# Patient Record
Sex: Male | Born: 1980 | Race: Black or African American | Hispanic: No | Marital: Single | Smoking: Never smoker
Health system: Southern US, Community
[De-identification: ages and names within clinical notes are randomized; demographics above are authoritative.]

---

## 2016-12-16 ENCOUNTER — Emergency Department: Payer: Self-pay

## 2016-12-16 ENCOUNTER — Observation Stay
Admission: EM | Admit: 2016-12-16 | Discharge: 2016-12-18 | Disposition: A | Discharge status: Home / Self Care | Payer: Self-pay | Attending: General Surgery | Admitting: General Surgery

## 2016-12-16 ENCOUNTER — Encounter: Payer: Self-pay | Admitting: Emergency Medicine

## 2016-12-16 DIAGNOSIS — K353 Acute appendicitis with localized peritonitis, without perforation or gangrene: Secondary | ICD-10-CM

## 2016-12-16 DIAGNOSIS — K3580 Unspecified acute appendicitis: Principal | ICD-10-CM | POA: Insufficient documentation

## 2016-12-16 DIAGNOSIS — K37 Unspecified appendicitis: Secondary | ICD-10-CM | POA: Diagnosis present

## 2016-12-16 LAB — URINALYSIS, COMPLETE (UACMP) WITH MICROSCOPIC
BACTERIA UA: NONE SEEN
Bilirubin Urine: NEGATIVE
Glucose, UA: NEGATIVE mg/dL
Hgb urine dipstick: NEGATIVE
Ketones, ur: NEGATIVE mg/dL
Nitrite: NEGATIVE
PROTEIN: NEGATIVE mg/dL
Specific Gravity, Urine: 1.016 (ref 1.005–1.030)
pH: 5 (ref 5.0–8.0)

## 2016-12-16 LAB — COMPREHENSIVE METABOLIC PANEL
ALBUMIN: 4.1 g/dL (ref 3.5–5.0)
ALK PHOS: 58 U/L (ref 38–126)
ALT: 33 U/L (ref 17–63)
ANION GAP: 7 (ref 5–15)
AST: 28 U/L (ref 15–41)
BUN: 12 mg/dL (ref 6–20)
CO2: 30 mmol/L (ref 22–32)
Calcium: 9 mg/dL (ref 8.9–10.3)
Chloride: 102 mmol/L (ref 101–111)
Creatinine, Ser: 1.1 mg/dL (ref 0.61–1.24)
GFR calc Af Amer: >60 mL/min (ref >60)
GFR calc non Af Amer: >60 mL/min (ref >60)
GLUCOSE: 99 mg/dL (ref 65–99)
POTASSIUM: 4 mmol/L (ref 3.5–5.1)
SODIUM: 139 mmol/L (ref 135–145)
Total Bilirubin: 0.9 mg/dL (ref 0.3–1.2)
Total Protein: 7.8 g/dL (ref 6.5–8.1)

## 2016-12-16 LAB — CBC
HEMATOCRIT: 43.9 % (ref 40.0–52.0)
HEMOGLOBIN: 15.1 g/dL (ref 13.0–18.0)
MCH: 29.7 pg (ref 26.0–34.0)
MCHC: 34.4 g/dL (ref 32.0–36.0)
MCV: 86.3 fL (ref 80.0–100.0)
Platelets: 247 10*3/uL (ref 150–440)
RBC: 5.08 MIL/uL (ref 4.40–5.90)
RDW: 14 % (ref 11.5–14.5)
WBC: 6.4 10*3/uL (ref 3.8–10.6)

## 2016-12-16 LAB — LIPASE, BLOOD: Lipase: 27 U/L (ref 11–51)

## 2016-12-16 MED ORDER — SODIUM CHLORIDE 0.9 % IV BOLUS (SEPSIS)
1000.0000 mL | Freq: Once | INTRAVENOUS | Status: AC
  Administered 2016-12-16: 1000 mL via INTRAVENOUS

## 2016-12-16 MED ORDER — FENTANYL CITRATE (PF) 100 MCG/2ML IJ SOLN
100.0000 ug | INTRAMUSCULAR | Status: DC | PRN
  Administered 2016-12-16: 100 ug via INTRAVENOUS
  Filled 2016-12-16: qty 2

## 2016-12-16 MED ORDER — IOPAMIDOL (ISOVUE-300) INJECTION 61%
100.0000 mL | Freq: Once | INTRAVENOUS | Status: AC | PRN
  Administered 2016-12-16: 100 mL via INTRAVENOUS

## 2016-12-16 NOTE — ED Notes (Signed)
Onalee Hua RN, aware of assigned bed

## 2016-12-16 NOTE — ED Provider Notes (Signed)
[facility] Emergency Department Provider Note    None    (approximate)  I have reviewed the triage vital signs and the nursing notes.   HISTORY  Chief Complaint Abdominal Pain    HPI Pedro Whiters is a 36 y.o. male Modena Jansky with a chief complaint of roughly 24-36 hours of initially crampy periumbilical abdominal pain now radiating to the right lower quadrant today. Patient does not endorse any vomiting or diarrhea. States that he did see some blood in his stool but it was not large volume hematochezia. No measured fevers but may have had some chills earlier in the day. Denies any chest pain or shortness of breath. No previous abdominal surgeries. No family history of Crohn's or ulcerative colitis. No family history of bleeding disorders. He does not smoke.  No fmhx of sickle cell disease or trait.    History reviewed. No pertinent past medical history. No family history on file. History reviewed. No pertinent surgical history. There are no active problems to display for this patient.     Prior to Admission medications   Not on File    Allergies Patient has no known allergies.    Social History Social History  Substance Use Topics  . Smoking status: Never Smoker  . Smokeless tobacco: Not on file  . Alcohol use Not on file    Review of Systems Patient denies headaches, rhinorrhea, blurry vision, numbness, shortness of breath, chest pain, edema, cough, abdominal pain, nausea, vomiting, diarrhea, dysuria, fevers, rashes or hallucinations unless otherwise stated above in HPI. ____________________________________________   PHYSICAL EXAM:  VITAL SIGNS: Vitals:   12/16/16 1741  BP: (!) 150/99  Pulse: 77  Resp: 16  Temp: 99 F (37.2 C)  SpO2: 98%    Constitutional: Alert and oriented. Well appearing and in no acute distress. Eyes: Conjunctivae are normal.  Head: Atraumatic. Nose: No congestion/rhinnorhea. Mouth/Throat: Mucous  membranes are moist.   Neck: No stridor. Painless ROM.  Cardiovascular: Normal rate, regular rhythm. Grossly normal heart sounds.  Good peripheral circulation. Respiratory: Normal respiratory effort.  No retractions. Lungs CTAB. Gastrointestinal: Soft with ttp of RLQ with rebound ttp.. No distention. No abdominal bruits. No CVA tenderness. Genitourinary:  Musculoskeletal: No lower extremity tenderness nor edema.  No joint effusions. Neurologic:  Normal speech and language. No gross focal neurologic deficits are appreciated. No facial droop Skin:  Skin is warm, dry and intact. No rash noted. Psychiatric: Mood and affect are normal. Speech and behavior are normal.  ____________________________________________   LABS (all labs ordered are listed, but only abnormal results are displayed)  Results for orders placed or performed during the hospital encounter of 12/16/16 (from the past 24 hour(s))  Lipase, blood     Status: None   Collection Time: 12/16/16  5:52 PM  Result Value Ref Range   Lipase 27 11 - 51 U/L  Comprehensive metabolic panel     Status: None   Collection Time: 12/16/16  5:52 PM  Result Value Ref Range   Sodium 139 135 - 145 mmol/L   Potassium 4.0 3.5 - 5.1 mmol/L   Chloride 102 101 - 111 mmol/L   CO2 30 22 - 32 mmol/L   Glucose, Bld 99 65 - 99 mg/dL   BUN 12 6 - 20 mg/dL   Creatinine, Ser 1.30 0.61 - 1.24 mg/dL   Calcium 9.0 8.9 - 86.5 mg/dL   Total Protein 7.8 6.5 - 8.1 g/dL   Albumin 4.1 3.5 - 5.0 g/dL  AST 28 15 - 41 U/L   ALT 33 17 - 63 U/L   Alkaline Phosphatase 58 38 - 126 U/L   Total Bilirubin 0.9 0.3 - 1.2 mg/dL   GFR calc non Af Amer >60 >60 mL/min   GFR calc Af Amer >60 >60 mL/min   Anion gap 7 5 - 15  CBC     Status: None   Collection Time: 12/16/16  5:52 PM  Result Value Ref Range   WBC 6.4 3.8 - 10.6 K/uL   RBC 5.08 4.40 - 5.90 MIL/uL   Hemoglobin 15.1 13.0 - 18.0 g/dL   HCT 16.1 09.6 - 04.5 %   MCV 86.3 80.0 - 100.0 fL   MCH 29.7 26.0 - 34.0  pg   MCHC 34.4 32.0 - 36.0 g/dL   RDW 40.9 81.1 - 91.4 %   Platelets 247 150 - 440 K/uL  Urinalysis, Complete w Microscopic     Status: Abnormal   Collection Time: 12/16/16  5:56 PM  Result Value Ref Range   Color, Urine YELLOW (A) YELLOW   APPearance HAZY (A) CLEAR   Specific Gravity, Urine 1.016 1.005 - 1.030   pH 5.0 5.0 - 8.0   Glucose, UA NEGATIVE NEGATIVE mg/dL   Hgb urine dipstick NEGATIVE NEGATIVE   Bilirubin Urine NEGATIVE NEGATIVE   Ketones, ur NEGATIVE NEGATIVE mg/dL   Protein, ur NEGATIVE NEGATIVE mg/dL   Nitrite NEGATIVE NEGATIVE   Leukocytes, UA SMALL (A) NEGATIVE   RBC / HPF 0-5 0 - 5 RBC/hpf   WBC, UA TOO NUMEROUS TO COUNT 0 - 5 WBC/hpf   Bacteria, UA NONE SEEN NONE SEEN   Squamous Epithelial / LPF 0-5 (A) NONE SEEN   Mucus PRESENT    ____________________________________________ ____________________________________________  RADIOLOGY  I personally reviewed all radiographic images ordered to evaluate for the above acute complaints and reviewed radiology reports and findings.  These findings were personally discussed with the patient.  Please see medical record for radiology report.  ____________________________________________   PROCEDURES  Procedure(s) performed:  Procedures    Critical Care performed: no ____________________________________________   INITIAL IMPRESSION / ASSESSMENT AND PLAN / ED COURSE  Pertinent labs & imaging results that were available during my care of the patient were reviewed by me and considered in my medical decision making (see chart for details).  DDX: appy, colitis,. Ibd, uti, hernia, constipation, msk strain  Lashon Hillier is a 36 y.o. who presents to the ED with Abdominal pain as described above. Patient afebrile with reassuring blood work however he does have significant tenderness right lower quadrant. Based on his exam I am concerned for the above differential particularly appendicitis will order CT imaging to  further characterize.  The patient will be placed on continuous pulse oximetry and telemetry for monitoring.   Clinical Course as of Dec 16 2325  Sat Dec 16, 2016  2313 CT imaging does show evidence of uncomplicated early appendicitis. Fortunately, the patient has no signs of sepsis at this point. I did consult Dr. Tonita Cong of general surgery. Discussed results of testing with patient. He is up-to-date with plan.  [PR]    Clinical Course User Index [PR] Willy Eddy, MD     ____________________________________________   FINAL CLINICAL IMPRESSION(S) / ED DIAGNOSES  Final diagnoses:  Acute appendicitis with localized peritonitis      NEW MEDICATIONS STARTED DURING THIS VISIT:  New Prescriptions   No medications on file     Note:  This document was prepared using Dragon  voice recognition software and may include unintentional dictation errors.    Willy Eddy, MD 12/16/16 [phone]

## 2016-12-16 NOTE — H&P (Signed)
Patient ID: Brendan Knight, male   DOB: 1980-08-28, 36 y.o.   MRN: [id number]  CC: Abdominal pain  HPI Brendan Knight is a 36 y.o. male who presents emergency department with complaints of abdominal pain. Patient reports that he had some crampy abdominal pain earlier in the week but earlier today he developed new right lower quadrant abdominal pain. He reports she's never had pain like this before. It is constant and worse with palpation. It is described as a stabbing sensation. He's also had laxative induced diarrhea and thinks he noticed some blood per stool. He denies any fevers, chills, nausea, vomiting, chest pain, shortness breath, constipation. He has had a headache and took Tylenol for that. He is otherwise in his usual state of excellent health.  HPI  History reviewed. No pertinent past medical history. Currently on no medications.  History reviewed. No pertinent surgical history. Has had no prior abdominal surgeries but had an orthoscopic knee surgery.  Family history: No family history known of cancer, diabetes, heart disease.  Social History Social History  Substance Use Topics  . Smoking status: Never Smoker  . Smokeless tobacco: Not on file  . Alcohol use Not on file    No Known Allergies  Current Facility-Administered Medications  Medication Dose Route Frequency Provider Last Rate Last Dose  . fentaNYL (SUBLIMAZE) injection 100 mcg  100 mcg Intravenous Q1H PRN Willy Eddy, MD   100 mcg at 12/16/16 2220   No current outpatient prescriptions on file.     Review of Systems A 10 point review of systems was asked and was negative except for the Findings documented in the history of present illness  Physical Exam Blood pressure (!) 156/98, pulse 76, temperature 99 F (37.2 C), temperature source Oral, resp. rate 16, height  (1.88 m), weight 102.1 kg (225 lb), SpO2 99 %. CONSTITUTIONAL: No acute distress. EYES: Pupils are equal, round, and reactive to  light, Sclera are non-icteric. EARS, NOSE, MOUTH AND THROAT: The oropharynx is clear. The oral mucosa is pink and moist. Hearing is intact to voice. LYMPH NODES:  Lymph nodes in the neck are normal. RESPIRATORY:  Lungs are clear. There is normal respiratory effort, with equal breath sounds bilaterally, and without pathologic use of accessory muscles. CARDIOVASCULAR: Heart is regular without murmurs, gallops, or rubs. GI: The abdomen is soft, minimally tender to palpation in the right lower quadrant at McBurney's point but negative Rovsing and no rebound or guarding, and nondistended. There are no palpable masses. There is no hepatosplenomegaly. There are normal bowel sounds in all quadrants. GU: Rectal deferred.   MUSCULOSKELETAL: Normal muscle strength and tone. No cyanosis or edema.   SKIN: Turgor is good and there are no pathologic skin lesions or ulcers. NEUROLOGIC: Motor and sensation is grossly normal. Cranial nerves are grossly intact. PSYCH:  Oriented to person, place and time. Affect is normal.  Data Reviewed Images and labs reviewed. Labs are all within normal limits with a normal white blood cell count of 6.4 and normal I's. CT scan shows a mildly dilated appendix consistent with early acute appendicitis. No evidence of free air or fluid fluid within the abdomen I have personally reviewed the patient's imaging, laboratory findings and medical records.    Assessment    Early acute appendicitis    Plan    36 year old male with early acute appendicitis. Described the treatment options of surgery versus admission for antibiotic therapy. Discussed risks, benefits, alternatives of each. After this discussion patient elects to attempt  antibiotic therapy. He understands that there is a failure rate of antibiotic therapy and he may likely require surgery for removal of his appendix. Plan to bring him into the hospital under observation. IV antibiotics, nothing by mouth, IV fluids, as needed  medications. His abdomen will be examined again in the morning and should his pain not be markedly improved he'll be offered surgery again. Patient agrees with this plan.     Time spent with the patient was 50 minutes, with more than 50% of the time spent in face-to-face education, counseling and care coordination.     Ricarda Frame, MD FACS General Surgeon 12/16/2016, 11:44 PM

## 2016-12-16 NOTE — ED Triage Notes (Signed)
Abdominal pain since yesterday. No vomiting or diarrhea. Cramping feeling mostly lower R abd.

## 2016-12-16 NOTE — ED Triage Notes (Signed)
Patient to stat desk asking about lab results and wait times. Patient updated on wait time. Patient in no acute distress at this time.

## 2016-12-17 ENCOUNTER — Encounter
Admission: EM | Disposition: A | Discharge status: Home / Self Care | Payer: Self-pay | Source: Home / Self Care | Attending: Student in an Organized Health Care Education/Training Program

## 2016-12-17 ENCOUNTER — Observation Stay: Payer: Self-pay | Admitting: Anesthesiology

## 2016-12-17 ENCOUNTER — Encounter: Payer: Self-pay | Admitting: *Deleted

## 2016-12-17 HISTORY — PX: LAPAROSCOPIC APPENDECTOMY: SHX408

## 2016-12-17 LAB — COMPREHENSIVE METABOLIC PANEL
ALK PHOS: 52 U/L (ref 38–126)
ALT: 28 U/L (ref 17–63)
ANION GAP: 7 (ref 5–15)
AST: 23 U/L (ref 15–41)
Albumin: 3.7 g/dL (ref 3.5–5.0)
BUN: 11 mg/dL (ref 6–20)
CALCIUM: 8.6 mg/dL — AB (ref 8.9–10.3)
CO2: 29 mmol/L (ref 22–32)
Chloride: 102 mmol/L (ref 101–111)
Creatinine, Ser: 1.05 mg/dL (ref 0.61–1.24)
GFR calc non Af Amer: >60 mL/min (ref >60)
Glucose, Bld: 106 mg/dL — ABNORMAL HIGH (ref 65–99)
Potassium: 3.7 mmol/L (ref 3.5–5.1)
SODIUM: 138 mmol/L (ref 135–145)
TOTAL PROTEIN: 7.1 g/dL (ref 6.5–8.1)
Total Bilirubin: 1 mg/dL (ref 0.3–1.2)

## 2016-12-17 LAB — CBC
HCT: 41.3 % (ref 40.0–52.0)
HEMOGLOBIN: 14.1 g/dL (ref 13.0–18.0)
MCH: 29.2 pg (ref 26.0–34.0)
MCHC: 34.2 g/dL (ref 32.0–36.0)
MCV: 85.3 fL (ref 80.0–100.0)
Platelets: 228 10*3/uL (ref 150–440)
RBC: 4.85 MIL/uL (ref 4.40–5.90)
RDW: 13.7 % (ref 11.5–14.5)
WBC: 6.6 10*3/uL (ref 3.8–10.6)

## 2016-12-17 LAB — MRSA PCR SCREENING: MRSA by PCR: NEGATIVE

## 2016-12-17 SURGERY — APPENDECTOMY, LAPAROSCOPIC
Anesthesia: General

## 2016-12-17 MED ORDER — ROCURONIUM BROMIDE 100 MG/10ML IV SOLN
INTRAVENOUS | Status: DC | PRN
  Administered 2016-12-17: 20 mg via INTRAVENOUS

## 2016-12-17 MED ORDER — HYDRALAZINE HCL 20 MG/ML IJ SOLN
10.0000 mg | Freq: Once | INTRAMUSCULAR | Status: DC
  Filled 2016-12-17: qty 1

## 2016-12-17 MED ORDER — LACTATED RINGERS IV SOLN
INTRAVENOUS | Status: DC | PRN
Start: 2016-12-17 — End: 2016-12-17
  Administered 2016-12-17 (×2): via INTRAVENOUS

## 2016-12-17 MED ORDER — PROPOFOL 10 MG/ML IV BOLUS
INTRAVENOUS | Status: DC | PRN
  Administered 2016-12-17: 200 mg via INTRAVENOUS

## 2016-12-17 MED ORDER — METRONIDAZOLE IN NACL 5-0.79 MG/ML-% IV SOLN
500.0000 mg | Freq: Three times a day (TID) | INTRAVENOUS | Status: DC
  Administered 2016-12-17 – 2016-12-18 (×4): 500 mg via INTRAVENOUS
  Filled 2016-12-17 (×7): qty 100

## 2016-12-17 MED ORDER — OXYCODONE-ACETAMINOPHEN 5-325 MG PO TABS
1.0000 | ORAL_TABLET | ORAL | Status: DC | PRN
  Administered 2016-12-17: 1 via ORAL
  Filled 2016-12-17: qty 1

## 2016-12-17 MED ORDER — BUPIVACAINE-EPINEPHRINE 0.25% -1:200000 IJ SOLN
INTRAMUSCULAR | Status: DC | PRN
  Administered 2016-12-17: 30 mL

## 2016-12-17 MED ORDER — LIDOCAINE HCL (PF) 2 % IJ SOLN
INTRAMUSCULAR | Status: AC
  Filled 2016-12-17: qty 2

## 2016-12-17 MED ORDER — MIDAZOLAM HCL 2 MG/2ML IJ SOLN
INTRAMUSCULAR | Status: AC
  Filled 2016-12-17: qty 2

## 2016-12-17 MED ORDER — ONDANSETRON HCL 4 MG/2ML IJ SOLN
INTRAMUSCULAR | Status: DC | PRN
  Administered 2016-12-17: 4 mg via INTRAVENOUS

## 2016-12-17 MED ORDER — BUPIVACAINE-EPINEPHRINE (PF) 0.25% -1:200000 IJ SOLN
INTRAMUSCULAR | Status: AC
  Filled 2016-12-17: qty 30

## 2016-12-17 MED ORDER — OXYCODONE HCL 5 MG PO TABS: 5.0000 mg | ORAL_TABLET | Freq: Once | ORAL | Status: DC | PRN

## 2016-12-17 MED ORDER — KETOROLAC TROMETHAMINE 30 MG/ML IJ SOLN
INTRAMUSCULAR | Status: DC | PRN
  Administered 2016-12-17: 30 mg via INTRAVENOUS

## 2016-12-17 MED ORDER — MORPHINE SULFATE (PF) 4 MG/ML IV SOLN: 4.0000 mg | INTRAVENOUS | Status: DC | PRN

## 2016-12-17 MED ORDER — KETOROLAC TROMETHAMINE 30 MG/ML IJ SOLN
30.0000 mg | Freq: Four times a day (QID) | INTRAMUSCULAR | Status: DC
  Administered 2016-12-17 – 2016-12-18 (×4): 30 mg via INTRAVENOUS
  Filled 2016-12-17 (×4): qty 1

## 2016-12-17 MED ORDER — DEXAMETHASONE SODIUM PHOSPHATE 10 MG/ML IJ SOLN
INTRAMUSCULAR | Status: DC | PRN
  Administered 2016-12-17: 10 mg via INTRAVENOUS

## 2016-12-17 MED ORDER — KETOROLAC TROMETHAMINE 30 MG/ML IJ SOLN
INTRAMUSCULAR | Status: AC
  Filled 2016-12-17: qty 1

## 2016-12-17 MED ORDER — EPHEDRINE SULFATE 50 MG/ML IJ SOLN
INTRAMUSCULAR | Status: DC | PRN
  Administered 2016-12-17: 25 mg via INTRAVENOUS

## 2016-12-17 MED ORDER — DIPHENHYDRAMINE HCL 25 MG PO CAPS: 25.0000 mg | ORAL_CAPSULE | Freq: Four times a day (QID) | ORAL | Status: DC | PRN

## 2016-12-17 MED ORDER — GLYCOPYRROLATE 0.2 MG/ML IJ SOLN
INTRAMUSCULAR | Status: DC | PRN
  Administered 2016-12-17: 0.4 mg via INTRAVENOUS

## 2016-12-17 MED ORDER — DIPHENHYDRAMINE HCL 50 MG/ML IJ SOLN: 25.0000 mg | Freq: Four times a day (QID) | INTRAMUSCULAR | Status: DC | PRN

## 2016-12-17 MED ORDER — NEOSTIGMINE METHYLSULFATE 10 MG/10ML IV SOLN
INTRAVENOUS | Status: AC
  Filled 2016-12-17: qty 1

## 2016-12-17 MED ORDER — FENTANYL CITRATE (PF) 250 MCG/5ML IJ SOLN
INTRAMUSCULAR | Status: AC
  Filled 2016-12-17: qty 5

## 2016-12-17 MED ORDER — HYDRALAZINE HCL 20 MG/ML IJ SOLN: 10.0000 mg | INTRAMUSCULAR | Status: DC | PRN

## 2016-12-17 MED ORDER — SUCCINYLCHOLINE CHLORIDE 20 MG/ML IJ SOLN
INTRAMUSCULAR | Status: DC | PRN
  Administered 2016-12-17: 100 mg via INTRAVENOUS

## 2016-12-17 MED ORDER — SUCCINYLCHOLINE CHLORIDE 20 MG/ML IJ SOLN
INTRAMUSCULAR | Status: AC
  Filled 2016-12-17: qty 1

## 2016-12-17 MED ORDER — PROPOFOL 10 MG/ML IV BOLUS
INTRAVENOUS | Status: AC
  Filled 2016-12-17: qty 20

## 2016-12-17 MED ORDER — FENTANYL CITRATE (PF) 100 MCG/2ML IJ SOLN
25.0000 ug | INTRAMUSCULAR | Status: DC | PRN
  Administered 2016-12-17 (×2): 50 ug via INTRAVENOUS

## 2016-12-17 MED ORDER — LIDOCAINE HCL (CARDIAC) 20 MG/ML IV SOLN
INTRAVENOUS | Status: DC | PRN
  Administered 2016-12-17: 40 mg via INTRAVENOUS

## 2016-12-17 MED ORDER — GLYCOPYRROLATE 0.2 MG/ML IJ SOLN
INTRAMUSCULAR | Status: AC
  Filled 2016-12-17: qty 2

## 2016-12-17 MED ORDER — OXYCODONE HCL 5 MG/5ML PO SOLN: 5.0000 mg | Freq: Once | ORAL | Status: DC | PRN

## 2016-12-17 MED ORDER — NEOSTIGMINE METHYLSULFATE 10 MG/10ML IV SOLN
INTRAVENOUS | Status: DC | PRN
  Administered 2016-12-17: 4 mg via INTRAVENOUS

## 2016-12-17 MED ORDER — FENTANYL CITRATE (PF) 100 MCG/2ML IJ SOLN
INTRAMUSCULAR | Status: DC | PRN
  Administered 2016-12-17: 150 ug via INTRAVENOUS
  Administered 2016-12-17: 100 ug via INTRAVENOUS

## 2016-12-17 MED ORDER — ONDANSETRON HCL 4 MG/2ML IJ SOLN: 4.0000 mg | Freq: Four times a day (QID) | INTRAMUSCULAR | Status: DC | PRN

## 2016-12-17 MED ORDER — CEFTRIAXONE SODIUM 2 G IJ SOLR
2.0000 g | INTRAMUSCULAR | Status: DC
  Administered 2016-12-17 – 2016-12-18 (×2): 2 g via INTRAVENOUS
  Filled 2016-12-17 (×3): qty 2

## 2016-12-17 MED ORDER — DEXTROSE IN LACTATED RINGERS 5 % IV SOLN
INTRAVENOUS | Status: DC
  Administered 2016-12-17 (×4): via INTRAVENOUS

## 2016-12-17 MED ORDER — ONDANSETRON 4 MG PO TBDP: 4.0000 mg | ORAL_TABLET | Freq: Four times a day (QID) | ORAL | Status: DC | PRN

## 2016-12-17 MED ORDER — MIDAZOLAM HCL 2 MG/2ML IJ SOLN
INTRAMUSCULAR | Status: DC | PRN
  Administered 2016-12-17: 2 mg via INTRAVENOUS

## 2016-12-17 MED ORDER — DEXAMETHASONE SODIUM PHOSPHATE 10 MG/ML IJ SOLN
INTRAMUSCULAR | Status: AC
  Filled 2016-12-17: qty 1

## 2016-12-17 MED ORDER — FENTANYL CITRATE (PF) 100 MCG/2ML IJ SOLN
INTRAMUSCULAR | Status: AC
  Administered 2016-12-17: 13:00:00
  Filled 2016-12-17: qty 2

## 2016-12-17 MED ORDER — ONDANSETRON HCL 4 MG/2ML IJ SOLN
INTRAMUSCULAR | Status: AC
  Filled 2016-12-17: qty 2

## 2016-12-17 SURGICAL SUPPLY — 36 items
APPLIER CLIP 5 13 M/L LIGAMAX5 (MISCELLANEOUS) ×3
BLADE CLIPPER SURG (BLADE) ×3 IMPLANT
CANISTER SUCT 1200ML W/VALVE (MISCELLANEOUS) ×3 IMPLANT
CHLORAPREP W/TINT 26ML (MISCELLANEOUS) ×3 IMPLANT
CLIP APPLIE 5 13 M/L LIGAMAX5 (MISCELLANEOUS) ×1 IMPLANT
CUTTER FLEX LINEAR 45M (STAPLE) ×3 IMPLANT
DERMABOND ADVANCED (GAUZE/BANDAGES/DRESSINGS) ×2
DERMABOND ADVANCED .7 DNX12 (GAUZE/BANDAGES/DRESSINGS) ×1 IMPLANT
ELECT CAUTERY BLADE 6.4 (BLADE) ×3 IMPLANT
ELECT REM PT RETURN 9FT ADLT (ELECTROSURGICAL) ×3
ELECTRODE REM PT RTRN 9FT ADLT (ELECTROSURGICAL) ×1 IMPLANT
GLOVE BIO SURGEON STRL SZ7 (GLOVE) ×9 IMPLANT
GOWN STRL REUS W/ TWL LRG LVL3 (GOWN DISPOSABLE) ×2 IMPLANT
GOWN STRL REUS W/TWL LRG LVL3 (GOWN DISPOSABLE) ×4
IRRIGATION STRYKERFLOW (MISCELLANEOUS) ×1 IMPLANT
IRRIGATOR STRYKERFLOW (MISCELLANEOUS) ×3
IV NS 1000ML (IV SOLUTION) ×2
IV NS 1000ML BAXH (IV SOLUTION) ×1 IMPLANT
NEEDLE HYPO 22GX1.5 SAFETY (NEEDLE) ×3 IMPLANT
NS IRRIG 500ML POUR BTL (IV SOLUTION) ×6 IMPLANT
PACK LAP CHOLECYSTECTOMY (MISCELLANEOUS) ×3 IMPLANT
PENCIL ELECTRO HAND CTR (MISCELLANEOUS) ×3 IMPLANT
POUCH SPECIMEN RETRIEVAL 10MM (ENDOMECHANICALS) ×3 IMPLANT
RELOAD 45 VASCULAR/THIN (ENDOMECHANICALS) ×3 IMPLANT
RELOAD STAPLE TA45 3.5 REG BLU (ENDOMECHANICALS) ×3 IMPLANT
SCALPEL HARMONIC ACE (MISCELLANEOUS) ×3 IMPLANT
SCISSORS METZENBAUM CVD 33 (INSTRUMENTS) ×3 IMPLANT
SLEEVE ENDOPATH XCEL 5M (ENDOMECHANICALS) ×3 IMPLANT
SPONGE LAP 18X18 5 PK (GAUZE/BANDAGES/DRESSINGS) ×3 IMPLANT
SUT MNCRL AB 4-0 PS2 18 (SUTURE) ×3 IMPLANT
SUT VICRYL 0 AB UR-6 (SUTURE) ×6 IMPLANT
SYR 20CC LL (SYRINGE) ×3 IMPLANT
TRAY FOLEY W/METER SILVER 16FR (SET/KITS/TRAYS/PACK) IMPLANT
TROCAR XCEL BLUNT TIP 100MML (ENDOMECHANICALS) ×3 IMPLANT
TROCAR XCEL NON-BLD 5MMX100MML (ENDOMECHANICALS) ×6 IMPLANT
TUBING INSUF HEATED (TUBING) ×3 IMPLANT

## 2016-12-17 NOTE — Anesthesia Post-op Follow-up Note (Signed)
Anesthesia QCDR form completed.        

## 2016-12-17 NOTE — ED Notes (Signed)
Pt transport to 205 

## 2016-12-17 NOTE — Anesthesia Preprocedure Evaluation (Signed)
Anesthesia Evaluation  Patient identified by MRN, date of birth, ID band Patient awake    Reviewed: Allergy & Precautions, H&P , NPO status , Patient's Chart, lab work & pertinent test results  Airway Mallampati: II  TM Distance: >3 FB Neck ROM: full    Dental  (+) Chipped   Pulmonary neg pulmonary ROS, neg shortness of breath,           Cardiovascular Exercise Tolerance: Good (-) angina(-) Past MI and (-) DOE negative cardio ROS       Neuro/Psych negative neurological ROS  negative psych ROS   GI/Hepatic negative GI ROS, Neg liver ROS, neg GERD  ,  Endo/Other  negative endocrine ROS  Renal/GU      Musculoskeletal   Abdominal   Peds  Hematology negative hematology ROS (+)   Anesthesia Other Findings  BMI    Body Mass Index:  28.89 kg/m      Reproductive/Obstetrics negative OB ROS                             Anesthesia Physical Anesthesia Plan  ASA: II  Anesthesia Plan: General ETT   Post-op Pain Management:    Induction: Intravenous  PONV Risk Score and Plan: 3 and Ondansetron, Dexamethasone, Midazolam and Treatment may vary due to age or medical condition  Airway Management Planned: Oral ETT  Additional Equipment:   Intra-op Plan:   Post-operative Plan: Extubation in OR  Informed Consent: I have reviewed the patients History and Physical, chart, labs and discussed the procedure including the risks, benefits and alternatives for the proposed anesthesia with the patient or authorized representative who has indicated his/her understanding and acceptance.   Dental Advisory Given  Plan Discussed with: Anesthesiologist, CRNA and Surgeon  Anesthesia Plan Comments: (Patient consented for risks of anesthesia including but not limited to:  - adverse reactions to medications - damage to teeth, lips or other oral mucosa - sore throat or hoarseness - Damage to heart, brain,  lungs or loss of life  Patient voiced understanding.)        Anesthesia Quick Evaluation

## 2016-12-17 NOTE — Addendum Note (Signed)
Addendum  created 12/17/16 1450 by Aalliyah Kilker, CRNA   Charge Capture section accepted    

## 2016-12-17 NOTE — Op Note (Signed)
laparascopic appendectomy   Madelyn Flavors Date of operation:  12/17/2016  Indications: The patient presented with a history of  abdominal pain. Workup has revealed findings consistent with acute appendicitis.  Pre-operative Diagnosis: Acute appendicitis without mention of peritonitis  Post-operative Diagnosis: Same  Surgeon: Sterling Big, MD, FACS  Anesthesia: General with endotracheal tube  Findings: Acute nonperforated appendicitis  Estimated Blood Loss: 5 cc         Specimens: appendix         Complications:  None  Procedure Details  The patient was seen again in the preop area. The options of surgery versus observation were reviewed with the patient and/or family. The risks of bleeding, infection, recurrence of symptoms, negative laparoscopy, potential for an open procedure, bowel injury, abscess or infection, were all reviewed as well. The patient was taken to Operating Room, identified as Thayden Lemire and the procedure verified as laparoscopic appendectomy. A Time Out was held and the above information confirmed.  The patient was placed in the supine position and general anesthesia was induced.  Antibiotic prophylaxis was administered and VT E prophylaxis was in place. A Foley catheter was placed by the nursing staff.   The abdomen was prepped and draped in a sterile fashion. An infraumbilical incision was made. A cutdown technique was used to enter the abdominal cavity. Two vicryl stitches were placed on the fascia and a Hasson trocar inserted. Pneumoperitoneum obtained. Two 5 mm ports were placed under direct visualization.  The appendix was identified and found to be acutely inflamed  The appendix was carefully dissected. The mesoappendix was divided withHarmonic scalpel. The base of the appendix was dissected out and divided with a standard load Endo GIA.The appendix was placed in a Endo Catch bag and removed via the Hasson port. The right lower quadrant and pelvis was  then irrigated with  normal saline which was aspirated. Inspection  failed to identify any additional bleeding and there were no signs of bowel injury. Again the right lower quadrant was inspected there was no sign of bleeding or bowel injury therefore pneumoperitoneum was released, all ports were removed.  The umbilical fascia was closed with 0 Vicryl interrupted sutures and the skin incisions were approximated with subcuticular 4-0 Monocryl. Dermabond was placed The patient tolerated the procedure well, there were no complications. The sponge lap and needle count were correct at the end of the procedure.  The patient was taken to the recovery room in stable condition to be admitted for continued care.    Sterling Big, MD FACS

## 2016-12-17 NOTE — Anesthesia Procedure Notes (Signed)
Procedure Name: Intubation Date/Time: 12/17/2016 11:11 AM Performed by: Clinton Sawyer Pre-anesthesia Checklist: Patient identified, Emergency Drugs available, Suction available, Patient being monitored and Timeout performed Patient Re-evaluated:Patient Re-evaluated prior to induction Oxygen Delivery Method: Circle system utilized Preoxygenation: Pre-oxygenation with 100% oxygen Induction Type: IV induction and Rapid sequence Laryngoscope Size: Mac and 4 Grade View: Grade I Tube type: Oral Tube size: 7.5 mm Number of attempts: 1 Airway Equipment and Method: Stylet Placement Confirmation: ETT inserted through vocal cords under direct vision,  positive ETCO2,  CO2 detector and breath sounds checked- equal and bilateral Secured at: 22 cm Tube secured with: Tape Dental Injury: Teeth and Oropharynx as per pre-operative assessment

## 2016-12-17 NOTE — Transfer of Care (Signed)
Immediate Anesthesia Transfer of Care Note  Patient: Brendan Knight  Procedure(s) Performed: Procedure(s): APPENDECTOMY LAPAROSCOPIC (N/A)  Patient Location: PACU  Anesthesia Type:General  Level of Consciousness: sedated  Airway & Oxygen Therapy: Patient Spontanous Breathing and Patient connected to nasal cannula oxygen  Post-op Assessment: Report given to RN and Post -op Vital signs reviewed and stable  Post vital signs: Reviewed and stable  Last Vitals:  Vitals:   12/17/16 1013 12/17/16 1205  BP: (!) 152/95 (P) 135/86  Pulse: 70 (!) (P) 56  Resp:  (!) (P) 8  Temp: 37 C (!) (P) 36.2 C  SpO2: 100% (P) 97%    Last Pain:  Vitals:   12/17/16 1013  TempSrc: Oral  PainSc:       Patients Stated Pain Goal: 0 (12/17/16 0107)  Complications: No apparent anesthesia complications

## 2016-12-17 NOTE — Anesthesia Postprocedure Evaluation (Signed)
Anesthesia Post Note  Patient: Brendan Knight  Procedure(s) Performed: Procedure(s) (LRB): APPENDECTOMY LAPAROSCOPIC (N/A)  Patient location during evaluation: PACU Anesthesia Type: General Level of consciousness: awake and alert Pain management: pain level controlled Vital Signs Assessment: post-procedure vital signs reviewed and stable Respiratory status: spontaneous breathing, nonlabored ventilation, respiratory function stable and patient connected to nasal cannula oxygen Cardiovascular status: blood pressure returned to baseline and stable Postop Assessment: no apparent nausea or vomiting Anesthetic complications: no     Last Vitals:  Vitals:   12/17/16 1236 12/17/16 1245  BP: 129/76   Pulse: (!) 51 (!) 54  Resp: 13 12  Temp: (!) 36.1 C   SpO2: 99% 98%    Last Pain:  Vitals:   12/17/16 1245  TempSrc:   PainSc: Asleep                 Cleda Mccreedy Piscitello

## 2016-12-17 NOTE — Progress Notes (Signed)
Asian seen and examined. He continues to have significant pain. His hemodynamics are adequate.PE : Tender to palpation over McBurney's point. The is some rebound.  A/p acute appendicitis with persistent pain and failure with antibiotic management. Discussed with the patient in detail about my recommendation  to proceed with laparoscopic appendectomy. Procedure discussed with patient in detail. Risk, benefits and possible complications including but not limited to: Bleeding, infection bowel injury and re-interventions discussed with him in detail. He understands and wishes to proceed

## 2016-12-18 MED ORDER — OXYCODONE-ACETAMINOPHEN 5-325 MG PO TABS: 1.0000 | ORAL_TABLET | ORAL | 0 refills | Status: DC | PRN

## 2016-12-18 NOTE — Care Management (Addendum)
Patient admitted post lap appy.  Self pay.  Patient states that he just relocated to the area [place] (which is in [facility]). Patient does not have a PCP.  Patient provided with information on [facility], Medication Management, and [employer] center.  Patient also provided with "The Network:  Your Guide to [employer] and [employer] in [facility]"  Booklet.  Patient to discharge with rx for pain medication  RNCM signing off.   Bevelyn Ngo RN, BSN Nurse Case Management: [facility] Coverage for :  [phone]

## 2016-12-18 NOTE — Progress Notes (Signed)
12/18/2016  1:40 PM  Brendan Knight to be D/C'd Home per MD order.  Discussed prescriptions and follow up appointments with the patient. Prescriptions given to patient, medication list explained in detail. Pt verbalized understanding.  Allergies as of 12/18/2016   No Known Allergies     Medication List    TAKE these medications   oxyCODONE-acetaminophen 5-325 MG tablet Commonly known as:  PERCOCET/ROXICET Take 1-2 tablets by mouth every 4 (four) hours as needed for moderate pain.            Discharge Care Instructions        Start     Ordered   12/18/16 0000  oxyCODONE-acetaminophen (PERCOCET/ROXICET) 5-325 MG tablet  Every 4 hours PRN     12/18/16 0918      Vitals:   12/17/16 1947 12/18/16 0457  BP: 126/76 129/73  Pulse: (!) 59 (!) 49  Resp:  20  Temp: 98.3 F (36.8 C) 98.2 F (36.8 C)  SpO2: 100% 100%    Skin clean, dry and intact without evidence of skin break down, no evidence of skin tears noted. IV catheter discontinued intact. Site without signs and symptoms of complications. Dressing and pressure applied. Pt denies pain at this time. No complaints noted.  An After Visit Summary was printed and given to the patient. Patient escorted via WC, and D/C home via private auto.  Brendan Knight

## 2016-12-18 NOTE — Discharge Instructions (Signed)
In addition to included general post-operative instructions for Laparoscopic Appendectomy,  Diet: Resume home heart healthy diet.   Activity: No heavy lifting >20 pounds (children, pets, laundry, garbage) or strenuous activity until follow-up, but light activity and walking are encouraged. Do not drive or drink alcohol if taking narcotic pain medications.  Wound care: 2 days after surgery (Tuesday, 9/18), may shower/get incision wet with soapy water and pat dry (do not rub incisions), but no baths or submerging incision underwater until follow-up.   Medications: Resume all home medications. For mild to moderate pain: acetaminophen (Tylenol) or ibuprofen (if no kidney disease). Combining Tylenol with alcohol can substantially increase your risk of causing liver disease. Narcotic pain medications, if prescribed, can be used for severe pain, though may cause nausea, constipation, and drowsiness. Do not combine Tylenol and Percocet within a 6 hour period as Percocet contains Tylenol. If you do not need the narcotic pain medication, you do not need to fill the prescription.  Call office [phone]) at any time if any questions, worsening pain, fevers/chills, bleeding, drainage from incision site, or other concerns.

## 2016-12-19 ENCOUNTER — Telehealth: Payer: Self-pay

## 2016-12-19 LAB — SURGICAL PATHOLOGY

## 2016-12-19 NOTE — Telephone Encounter (Signed)
Left message for patient to return call regarding post op questions.  

## 2016-12-19 NOTE — Telephone Encounter (Signed)
Post-op call made to patient at this time. Spoke with Madelyn Flavors. Post-op interview questions below.  1. How are you feeling? Some sorness  2. Is your pain controlled? yes  3. What are you doing for the pain? Taking tyelnol  4. Are you having any Nausea or Vomiting? None  5. Are you having any Fever or Chills? None  6. Are you having any Constipation or Diarrhea? None  7. Is there any Swelling or Bruising you are concerned about? None  8. Do you have any questions or concerns at this time? none   Discussion: Reminded of post op appointment for 10/1 at 9:30AM with Dr, Everlene Farrier patient verbalized understanding.

## 2016-12-20 NOTE — Discharge Summary (Signed)
Physician Discharge Summary  Patient ID: Brendan Knight MRN: [id number] DOB/AGE: 36-Jan-1982 36 y.o.  Admit date: 12/16/2016 Discharge date: 12/20/2016  Admission Diagnoses:  Discharge Diagnoses:  Active Problems:   Appendicitis   Discharged Condition: good  Hospital Course: 36 year old Male presented to [facility] ED for abdominal pain, and workup was found to be significant for acute appendicitis. Patient underwent laparoscopic appendectomy with Dr. Everlene Farrier, and the remainder of his post-operative course was unremarkable. Accordingly, patient's pain was well controlled, he was able to tolerate advancement of his diet, discharge planning was initiated, and patient was safely able to be discharged home with appropriate discharge instructions, pain control, and outpatient surgical follow-up appointment after all of his questions were answered to his expressed satisfaction.  Consults: None  Significant Diagnostic Studies: radiology: CT scan: acute non-perforated appendicitis  Treatments: surgery: laparoscopic appendectomy (Dr. Everlene Farrier, 12/17/2016)  Discharge Exam: Blood pressure 129/73, pulse (!) 49, temperature 98.2 F (36.8 C), temperature source Oral, resp. rate 20, height  (1.88 m), weight 225 lb (102.1 kg), SpO2 100 %. General appearance: alert, cooperative and no distress GI: abdomen soft and non-distended with appropriate mild peri-incisional tenderness to palpation with incisions well-approximated without surrouding erythema or drainage  Disposition: 01-Home or Self Care   Allergies as of 12/18/2016   No Known Allergies     Medication List    TAKE these medications   oxyCODONE-acetaminophen 5-325 MG tablet Commonly known as:  PERCOCET/ROXICET Take 1-2 tablets by mouth every 4 (four) hours as needed for moderate pain.            Discharge Care Instructions        Start     Ordered   12/18/16 0000  oxyCODONE-acetaminophen (PERCOCET/ROXICET) 5-325 MG tablet  Every 4  hours PRN     12/18/16 0918     Follow-up Information    [facility]. Schedule an appointment as soon as possible for a visit in 2 week(s).   Specialty:  General Surgery Why:  Dr. Everlene Farrier, Monday, 10/1 at 9:15 a.m., [place] office, [phone] Contact information: [address] [place] [address] [phone]          Signed: Ancil Linsey 12/20/2016, 9:40 AM

## 2017-01-01 ENCOUNTER — Encounter: Payer: Self-pay | Admitting: Surgery

## 2017-01-01 ENCOUNTER — Ambulatory Visit (INDEPENDENT_AMBULATORY_CARE_PROVIDER_SITE_OTHER): Payer: Self-pay | Admitting: Surgery

## 2017-01-01 VITALS — BP 156/101 | HR 60 | Temp 98.2°F | Ht 74.0 in | Wt 246.5 lb

## 2017-01-01 DIAGNOSIS — Z09 Encounter for follow-up examination after completed treatment for conditions other than malignant neoplasm: Secondary | ICD-10-CM

## 2017-01-01 NOTE — Progress Notes (Signed)
S/p lap appy Path d/w pt Taking po, doing well Some mild pain No fevers, + BM  PE NAD Abd: soft, nt, incisions c/d/i, no infection  A/P Doing well No heavy lifting RTC prn

## 2017-01-01 NOTE — Patient Instructions (Signed)

## 2017-07-27 LAB — HIV ANTIBODY (ROUTINE TESTING W REFLEX): HIV Screen 4th Generation wRfx: NONREACTIVE

## 2018-10-08 IMAGING — CT CT ABD-PELV W/ CM
2 of 4 series · 16 of 46 positions shown, 18 images · IV contrast (APPLIED)
Comparison: None.

CLINICAL DATA: Abdominal pain since yesterday without vomiting or
diarrhea. Cramping mostly in the right lower abdomen.

EXAM:
CT ABDOMEN AND PELVIS WITH CONTRAST
TECHNIQUE: Multidetector CT imaging of the abdomen and pelvis was performed
using the standard protocol following bolus administration of
intravenous contrast.
CONTRAST:  100mL JKDOE4-GFF IOPAMIDOL (JKDOE4-GFF) INJECTION 61%

[Series 2: routine abd/pel with · axial · 0.78mm/px · z∈[-994,-544]mm · 13 of 100 slices shown, 15 images]
[im 5/100  soft-tissue]
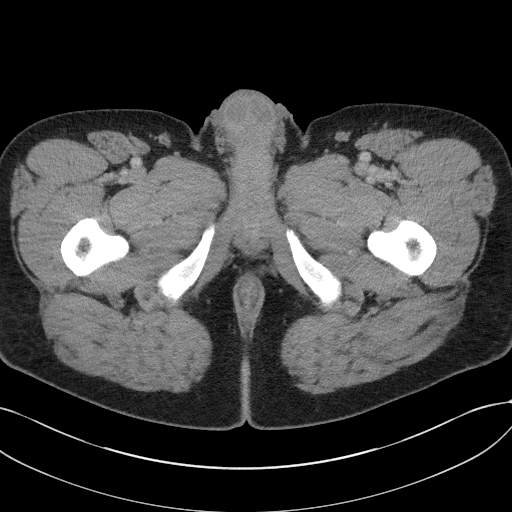
[im 5/100  bone]
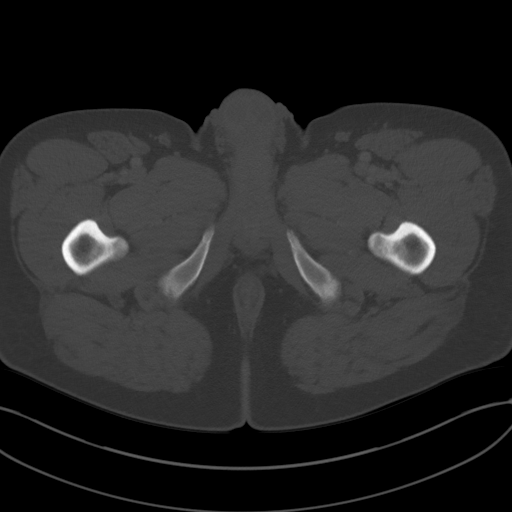
[im 13/100  soft-tissue]
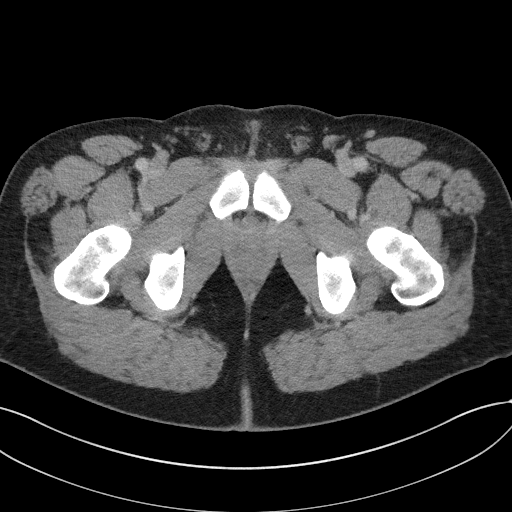
[im 21/100  soft-tissue]
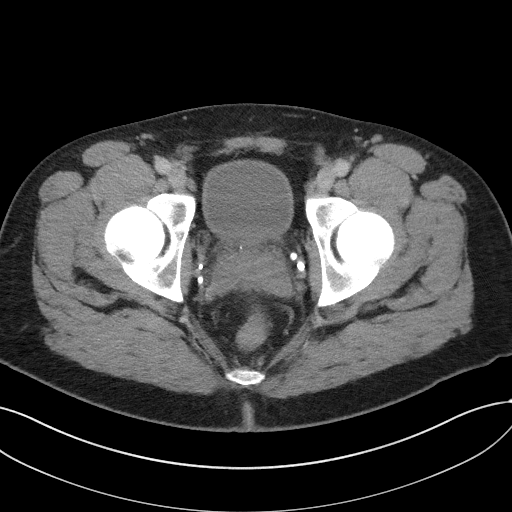
[im 29/100  soft-tissue]
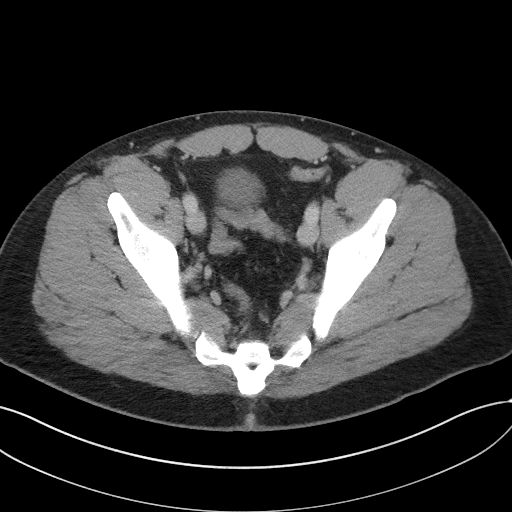
[im 34/100  soft-tissue]
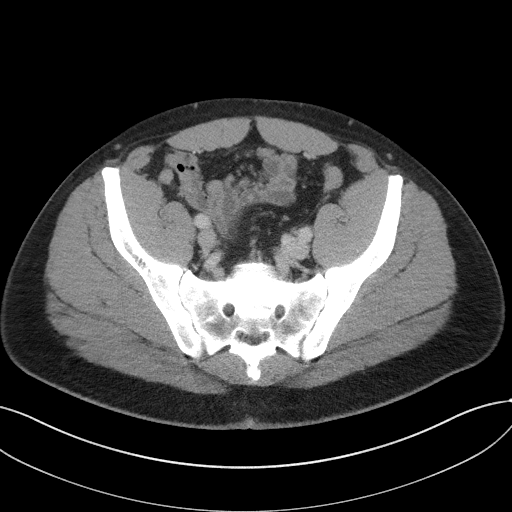
[im 42/100  soft-tissue]
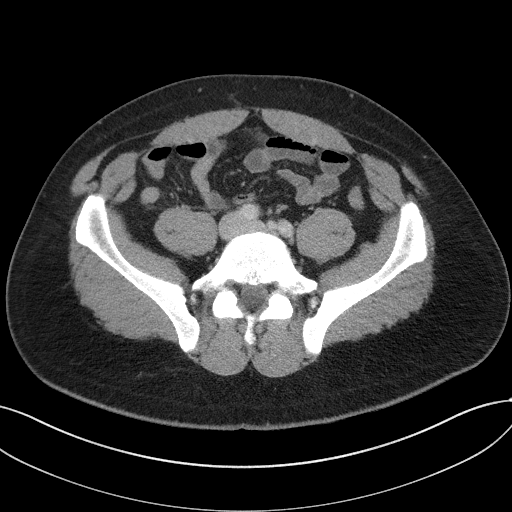
[im 50/100  soft-tissue]
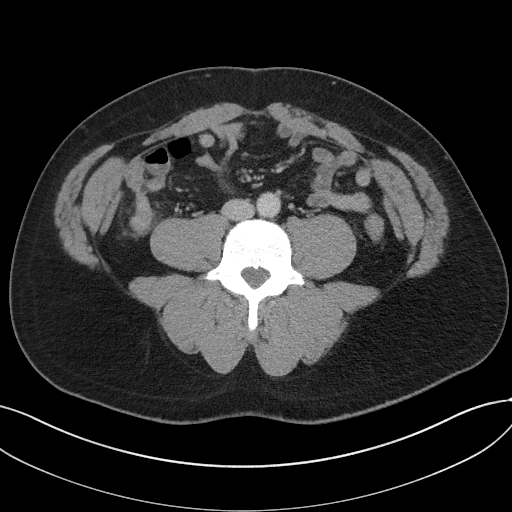
[im 58/100  soft-tissue]
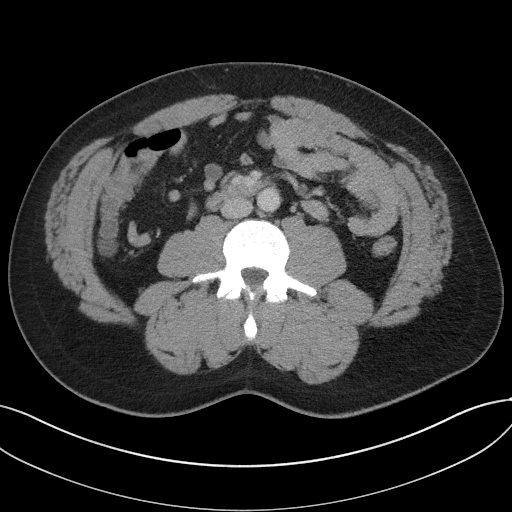
[im 67/100  soft-tissue]
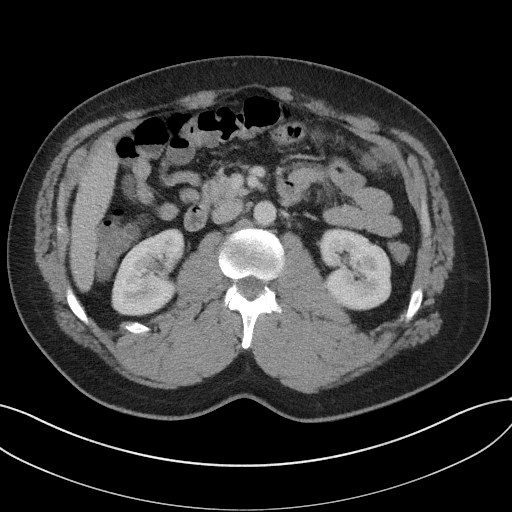
[im 67/100  bone]
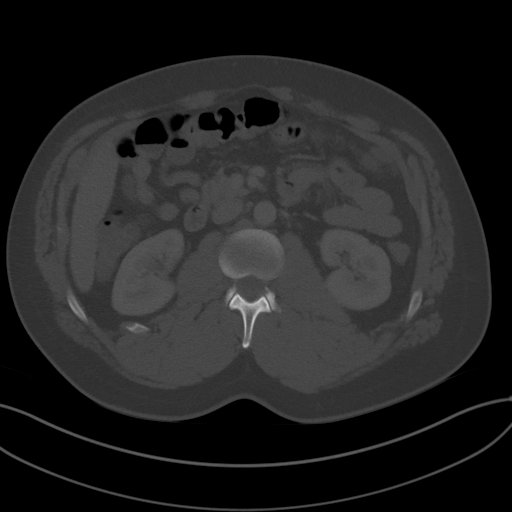
[im 71/100  soft-tissue]
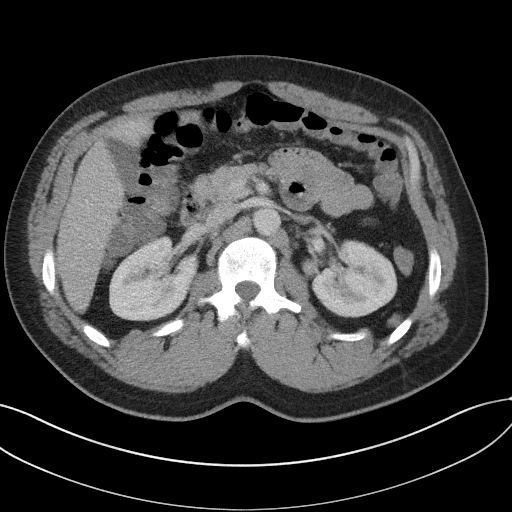
[im 79/100  soft-tissue]
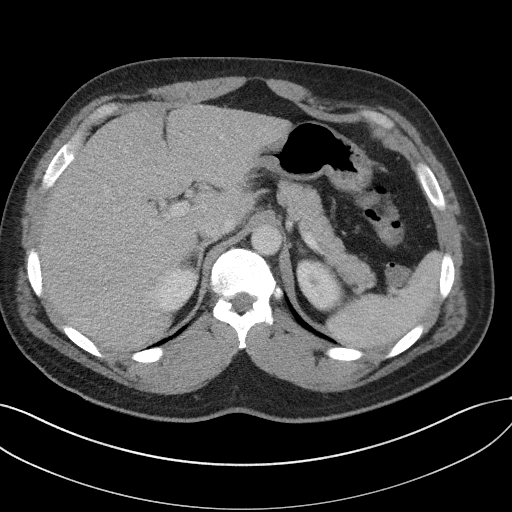
[im 87/100  soft-tissue]
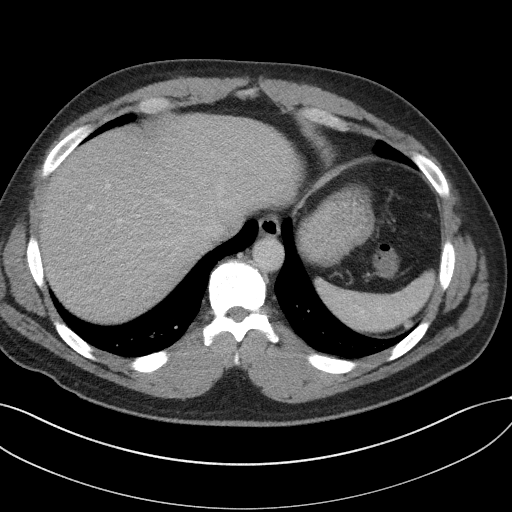
[im 95/100  soft-tissue]
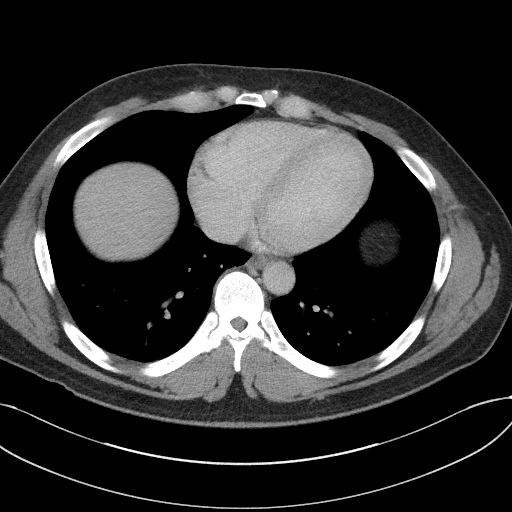

[Series 5: coronal st · coronal · 0.79mm/px · 3 of 95 slices shown]
[im 32/95  soft-tissue]
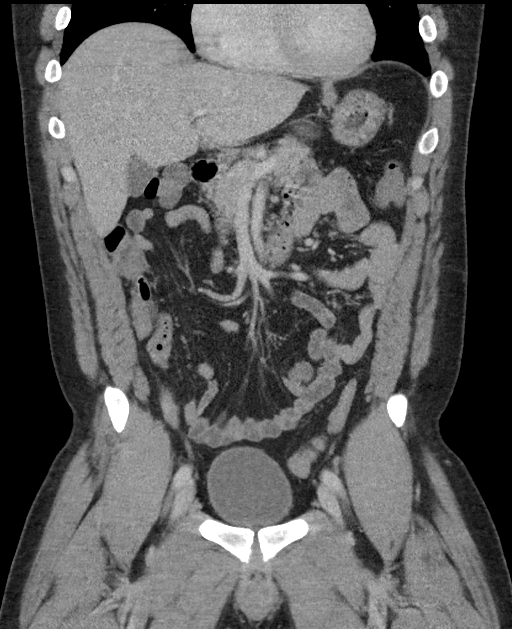
[im 42/95  soft-tissue]
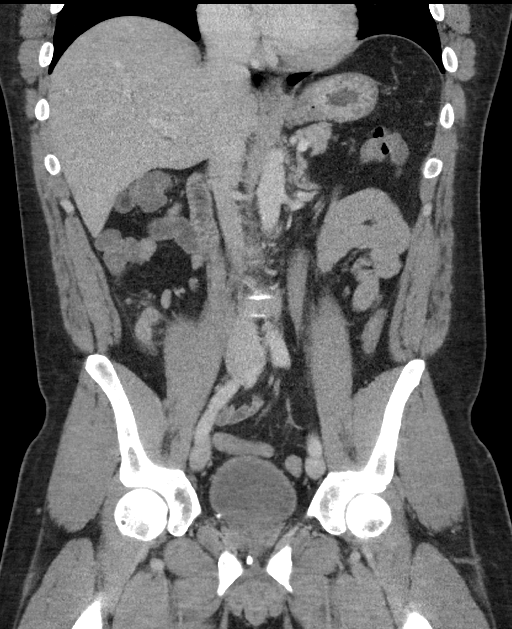
[im 53/95  soft-tissue]
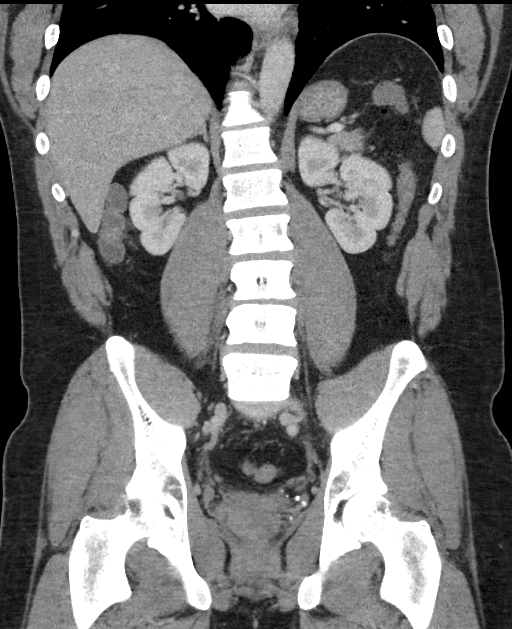

[16 of 46 positions shown; findings below may reference images not displayed]

FINDINGS: LOWER CHEST: Lung bases are clear. Minimal bibasilar dependent
atelectasis. Included heart size is normal. No pericardial effusion.

HEPATOBILIARY: Liver and gallbladder are normal.

PANCREAS: Normal.

SPLEEN: Normal.  Tiny adjacent splenule along its lateral aspect.

ADRENALS/URINARY TRACT: Kidneys are orthotopic, demonstrating
symmetric enhancement. No nephrolithiasis, hydronephrosis or solid
renal masses. Urinary bladder is partially distended and
unremarkable. Normal adrenal glands.

STOMACH/BOWEL: The stomach, small and large bowel are normal in
course and caliber without inflammatory changes. Inflamed thickened
appendix with mild periappendiceal inflammation measuring 12 mm at
McBurney's point.

VASCULAR/LYMPHATIC: Aortoiliac vessels are normal in course and
caliber. No lymphadenopathy by CT size criteria. Small reactive
lymph nodes in the right lower quadrant.

REPRODUCTIVE: Normal.

OTHER: No intraperitoneal free fluid or free air.

MUSCULOSKELETAL: Nonacute.
IMPRESSION: Acute uncomplicated appendicitis at McBurney's point with the
appendix measuring 12 mm in caliber and demonstrating mild
periappendiceal fatty inflammation. Reactive lymph nodes are seen in
the right lower quadrant.

## 2018-11-25 ENCOUNTER — Encounter: Payer: Self-pay | Admitting: Physician Assistant

## 2018-11-25 ENCOUNTER — Ambulatory Visit: Payer: Self-pay | Admitting: Physician Assistant

## 2018-11-25 ENCOUNTER — Other Ambulatory Visit: Payer: Self-pay

## 2018-11-25 DIAGNOSIS — Z202 Contact with and (suspected) exposure to infections with a predominantly sexual mode of transmission: Secondary | ICD-10-CM

## 2018-11-25 DIAGNOSIS — Z113 Encounter for screening for infections with a predominantly sexual mode of transmission: Secondary | ICD-10-CM

## 2018-11-25 MED ORDER — AZITHROMYCIN 500 MG PO TABS
1000.0000 mg | ORAL_TABLET | Freq: Once | ORAL | Status: AC
  Administered 2018-11-25: 1000 mg via ORAL

## 2018-11-25 NOTE — Progress Notes (Signed)
    STI clinic/screening visit  Subjective:  Brendan Knight is a 38 y.o. male being seen today for an STI screening visit. The patient reports they do not have symptoms.  Patient has the following medical conditions:   Patient Active Problem List   Diagnosis Date Noted  . Appendicitis 12/16/2016     Chief Complaint  Patient presents with  . SEXUALLY TRANSMITTED DISEASE    HPI  Patient reports that he is a contact to Chlamydia.  Denies current symptoms.  Reports that he has symptoms for 2-3 days about 1 week ago but the symptoms resolved after that.   See flowsheet for further details and programmatic requirements.    The following portions of the patient's history were reviewed and updated as appropriate: allergies, current medications, past medical history, past social history, past surgical history and problem list.  Objective:  There were no vitals filed for this visit.  Physical Exam Constitutional:      General: He is not in acute distress.    Appearance: Normal appearance.  HENT:     Head: Normocephalic and atraumatic.     Mouth/Throat:     Mouth: Mucous membranes are moist.     Pharynx: Oropharynx is clear. No oropharyngeal exudate or posterior oropharyngeal erythema.  Eyes:     Conjunctiva/sclera: Conjunctivae normal.  Neck:     Musculoskeletal: Neck supple.  Pulmonary:     Effort: Pulmonary effort is normal.  Abdominal:     Palpations: Abdomen is soft. There is no mass.     Tenderness: There is no abdominal tenderness. There is no guarding or rebound.  Genitourinary:    Penis: Normal.      Scrotum/Testes: Normal.  Lymphadenopathy:     Cervical: No cervical adenopathy.  Skin:    General: Skin is warm and dry.     Findings: No bruising, erythema, lesion or rash.  Neurological:     Mental Status: He is alert and oriented to person, place, and time.  Psychiatric:        Mood and Affect: Mood normal.        Thought Content: Thought content normal.         Judgment: Judgment normal.       Assessment and Plan:  Brendan Knight is a 38 y.o. male presenting to the [facility] Department for STI screening  1. Screening for STD (sexually transmitted disease) Patient without current symptoms.  Declines HIV testing today since had it done recently. Rec condoms with all sex Await test results.  Counseled that RN will call if needs to RTC for further treatment once results are back.   - Chlamydia/Gonorrhea [facility] - Syphilis Serology, [facility] - Chlamydia/Gonorrhea [facility]  2. 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 No sex for 7 days and until after partner completes treatment. Rec RTC if vomits < 2 hr after taking medicine for re-treatment.  - azithromycin (ZITHROMAX) tablet 1,000 mg     No follow-ups on file.  No future appointments.  Jerene Dilling, PA

## 2023-02-02 DEATH — deceased
